# Patient Record
Sex: Male | Born: 1970 | Race: Black or African American | Hispanic: No | Marital: Single | Smoking: Current every day smoker
Health system: Southern US, Community
[De-identification: ages and names within clinical notes are randomized; demographics above are authoritative.]

## PROBLEM LIST (undated history)

## (undated) DIAGNOSIS — I10 Essential (primary) hypertension: Secondary | ICD-10-CM

## (undated) HISTORY — DX: Essential (primary) hypertension: I10

---

## 2003-07-23 ENCOUNTER — Emergency Department (HOSPITAL_COMMUNITY): Admission: EM | Admit: 2003-07-23 | Discharge: 2003-07-23 | Payer: Self-pay | Admitting: Emergency Medicine
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
## 2011-01-20 DIAGNOSIS — I10 Essential (primary) hypertension: Secondary | ICD-10-CM | POA: Insufficient documentation

## 2011-01-20 HISTORY — DX: Essential (primary) hypertension: I10

## 2012-03-05 ENCOUNTER — Other Ambulatory Visit: Payer: Self-pay | Admitting: Emergency Medicine

## 2012-04-08 ENCOUNTER — Other Ambulatory Visit: Payer: Self-pay | Admitting: Physician Assistant

## 2012-04-08 NOTE — Telephone Encounter (Signed)
Chart pulled at pa pool DOS: 01/24/12

## 2012-04-08 NOTE — Telephone Encounter (Signed)
Please pull paper chart.  

## 2012-07-03 ENCOUNTER — Other Ambulatory Visit: Payer: Self-pay | Admitting: Physician Assistant

## 2013-04-08 ENCOUNTER — Other Ambulatory Visit: Payer: Self-pay | Admitting: Physician Assistant

## 2017-09-03 ENCOUNTER — Ambulatory Visit: Payer: Self-pay | Admitting: Internal Medicine

## 2017-09-03 ENCOUNTER — Encounter: Payer: Self-pay | Admitting: Internal Medicine

## 2017-09-03 VITALS — BP 190/110 | HR 84 | Resp 14 | Ht 75.0 in | Wt 259.0 lb

## 2017-09-03 DIAGNOSIS — Z6831 Body mass index (BMI) 31.0-31.9, adult: Secondary | ICD-10-CM

## 2017-09-03 DIAGNOSIS — E669 Obesity, unspecified: Secondary | ICD-10-CM

## 2017-09-03 DIAGNOSIS — M25579 Pain in unspecified ankle and joints of unspecified foot: Secondary | ICD-10-CM

## 2017-09-03 DIAGNOSIS — Z716 Tobacco abuse counseling: Secondary | ICD-10-CM

## 2017-09-03 DIAGNOSIS — I10 Essential (primary) hypertension: Secondary | ICD-10-CM

## 2017-09-03 MED ORDER — LISINOPRIL-HYDROCHLOROTHIAZIDE 20-12.5 MG PO TABS: 1.0000 | ORAL_TABLET | Freq: Every day | ORAL | 3 refills | Status: DC

## 2017-09-03 NOTE — Progress Notes (Signed)
Subjective:    Patient ID: Philip Parrish, male    Jabier GaussDOB: 1970-04-20, 47 y.o.   MRN: [id number]  HPI Here to establish  1.  Essential Hypertension:  Diagnosed in 2013.  Has been controlled down to 140/80 with Lisinopril/HCTZ 20-12.5 mg.  Has been off medication for about a month--was halving medication prior to that to make them last.    Diet:  Processed food 1st meal:  8 a.m.:  Cheese eggs and toast/fried bologna.  Second meal at 8 p.m.:  Steak or spaghetti, pork chop. Veggies.    2.  Tobacco Use:  Black and Milds:  3-4 daily.  Smoked for 22 years.  No cigarettes Review of Systems  3.  Maybe gout of right toe and ankle abour 2-3 months ago.  Had been eating rich foods:  Steak and shrimp  Current Meds  Medication Sig  . lisinopril-hydrochlorothiazide (PRINZIDE,ZESTORETIC) 20-12.5 MG tablet Take 1 tablet by mouth daily.  . Multiple Vitamin (MULTIVITAMIN) tablet Take 1 tablet by mouth daily.  . [DISCONTINUED] lisinopril-hydrochlorothiazide (PRINZIDE,ZESTORETIC) 20-12.5 MG per tablet TAKE 1 TABLET BY MOUTH DAILY    No Known Allergies    Past Medical History:  Diagnosis Date  . Hypertension 2013   History reviewed. No pertinent surgical history.   Family History  Problem Relation Age of Onset  . Hypertension Mother   . Cancer Mother        Pancreatic cancer in 2015  . Diabetes Father        Died of complications  . Kidney disease Father   . Hyperlipidemia Father   . Hypertension Father   . Asthma Daughter   . Hearing loss Daughter     Social History   Socioeconomic History  . Marital status: Single    Spouse name: Not on file  . Number of children: Not on file  . Years of education: Not on file  . Highest education level: Not on file  Occupational History  . Occupation: previously Location managermachine operator  Social Needs  . Financial resource strain: Not on file  . Food insecurity:    Worry: Never true    Inability: Never true  . Transportation needs:    Medical:  No    Non-medical: No  Tobacco Use  . Smoking status: Current Every Day Smoker    Packs/day: 3.00    Years: 2.00    Pack years: 6.00    Types: Cigars  . Smokeless tobacco: Never Used  Substance and Sexual Activity  . Alcohol use: Yes    Alcohol/week: 2.0 standard drinks    Types: 2 Shots of liquor per week  . Drug use: Never  . Sexual activity: Not on file  Lifestyle  . Physical activity:    Days per week: Not on file    Minutes per session: Not on file  . Stress: Not at all  Relationships  . Social connections:    Talks on phone: Not on file    Gets together: Not on file    Attends religious service: Not on file    Active member of club or organization: Not on file    Attends meetings of clubs or organizations: Not on file    Relationship status: Not on file  . Intimate partner violence:    Fear of current or ex partner: Not on file    Emotionally abused: No    Physically abused: No    Forced sexual activity: Not on file  Other Topics Concern  .  Not on file  Social History Narrative   Lives alone in apartment.   47 yo daughter is a Consulting civil engineerstudent at [employer]     Objective:   Physical Exam NAD HEENT:  PERRL, EOMI, discs sharp,  TMs pearly gray, throat without injection.   Neck:  Supple, No adenopathy, no thyromegaly Chest:  CTA CV:  RRR with normal S1 and S2, No S3, S4 or murmur.  No carotid bruit.  Carotid, radial and DP pulses normal and equal. Abd:  S, NT, No HSM or mass, + BS LE:  No edema. MS:  No current inflammation of joint, particularly MTP joints.       Assessment & Plan:  1.  Hypertension:  Lisinopril/HCTZ 20/12.5 mg daily.  Followup in 2 weeks for bp and pulse check with fasting labs:  FLP, CBC, CMP, uric acid.  2.  Possible Gout:  To lay off a lot of shellfish all at once.  Uric acid as above.    3.  Tobacco abuse:  Quitline card given.  Discussed discontinuing options.  4.  Obesity:  To work on diet and gradually increasing daily  physical activity.  Follow up with me in 3 months.

## 2017-09-03 NOTE — Progress Notes (Signed)
LCSW completed new patient screening with patient in order to assess for mental health concerns and/or problems with social determinants of health (food, housing, transportation, interpersonal violence).  Pt reported that he had no problems with any of the SDOH. He denied any current mental health symptoms, sharing that his stress level is very low and he tries to stay positive.  No follow up needed.

## 2017-09-03 NOTE — Patient Instructions (Signed)

## 2017-10-15 ENCOUNTER — Other Ambulatory Visit: Payer: Self-pay

## 2017-10-22 ENCOUNTER — Other Ambulatory Visit: Payer: Self-pay

## 2017-10-29 ENCOUNTER — Other Ambulatory Visit: Payer: Self-pay

## 2017-10-29 DIAGNOSIS — Z13220 Encounter for screening for lipoid disorders: Secondary | ICD-10-CM

## 2017-10-29 DIAGNOSIS — M109 Gout, unspecified: Secondary | ICD-10-CM

## 2017-10-29 DIAGNOSIS — Z79899 Other long term (current) drug therapy: Secondary | ICD-10-CM

## 2017-10-30 LAB — COMPREHENSIVE METABOLIC PANEL
A/G RATIO: 1.8 (ref 1.2–2.2)
ALBUMIN: 4.6 g/dL (ref 3.5–5.5)
ALK PHOS: 39 IU/L (ref 39–117)
ALT: 27 IU/L (ref 0–44)
AST: 24 IU/L (ref 0–40)
BILIRUBIN TOTAL: 0.4 mg/dL (ref 0.0–1.2)
BUN / CREAT RATIO: 10 (ref 9–20)
BUN: 11 mg/dL (ref 6–24)
CHLORIDE: 97 mmol/L (ref 96–106)
CO2: 26 mmol/L (ref 20–29)
Calcium: 10.1 mg/dL (ref 8.7–10.2)
Creatinine, Ser: 1.09 mg/dL (ref 0.76–1.27)
GFR calc non Af Amer: 80 mL/min/{1.73_m2} (ref >59)
GFR, EST AFRICAN AMERICAN: 93 mL/min/{1.73_m2} (ref >59)
GLOBULIN, TOTAL: 2.5 g/dL (ref 1.5–4.5)
Glucose: 96 mg/dL (ref 65–99)
POTASSIUM: 4.7 mmol/L (ref 3.5–5.2)
SODIUM: 138 mmol/L (ref 134–144)
Total Protein: 7.1 g/dL (ref 6.0–8.5)

## 2017-10-30 LAB — CBC WITH DIFFERENTIAL/PLATELET
BASOS: 1 %
Basophils Absolute: 0 10*3/uL (ref 0.0–0.2)
EOS (ABSOLUTE): 0.2 10*3/uL (ref 0.0–0.4)
Eos: 3 %
HEMATOCRIT: 39.9 % (ref 37.5–51.0)
Hemoglobin: 14 g/dL (ref 13.0–17.7)
IMMATURE GRANS (ABS): 0 10*3/uL (ref 0.0–0.1)
Immature Granulocytes: 0 %
LYMPHS ABS: 2.4 10*3/uL (ref 0.7–3.1)
Lymphs: 45 %
MCH: 29.9 pg (ref 26.6–33.0)
MCHC: 35.1 g/dL (ref 31.5–35.7)
MCV: 85 fL (ref 79–97)
Monocytes Absolute: 0.6 10*3/uL (ref 0.1–0.9)
Monocytes: 11 %
NEUTROS ABS: 2.1 10*3/uL (ref 1.4–7.0)
Neutrophils: 40 %
Platelets: 208 10*3/uL (ref 150–450)
RBC: 4.68 x10E6/uL (ref 4.14–5.80)
RDW: 13.4 % (ref 12.3–15.4)
WBC: 5.3 10*3/uL (ref 3.4–10.8)

## 2017-10-30 LAB — LIPID PANEL W/O CHOL/HDL RATIO
Cholesterol, Total: 244 mg/dL — ABNORMAL HIGH (ref 100–199)
HDL: 65 mg/dL (ref >39)
LDL Calculated: 148 mg/dL — ABNORMAL HIGH (ref 0–99)
Triglycerides: 153 mg/dL — ABNORMAL HIGH (ref 0–149)
VLDL Cholesterol Cal: 31 mg/dL (ref 5–40)

## 2017-10-30 LAB — URIC ACID: Uric Acid: 7.5 mg/dL (ref 3.7–8.6)

## 2017-12-06 ENCOUNTER — Encounter: Payer: Self-pay | Admitting: Internal Medicine

## 2017-12-17 ENCOUNTER — Ambulatory Visit: Payer: Self-pay | Admitting: Internal Medicine

## 2017-12-22 ENCOUNTER — Ambulatory Visit: Payer: Self-pay | Admitting: Internal Medicine

## 2018-01-28 ENCOUNTER — Ambulatory Visit: Payer: Self-pay | Admitting: Internal Medicine

## 2018-02-17 ENCOUNTER — Other Ambulatory Visit: Payer: Self-pay

## 2018-02-18 ENCOUNTER — Other Ambulatory Visit: Payer: Self-pay

## 2018-02-18 DIAGNOSIS — Z13220 Encounter for screening for lipoid disorders: Secondary | ICD-10-CM

## 2018-02-19 LAB — LIPID PANEL W/O CHOL/HDL RATIO
Cholesterol, Total: 274 mg/dL — ABNORMAL HIGH (ref 100–199)
HDL: 60 mg/dL (ref >39)
LDL Calculated: 185 mg/dL — ABNORMAL HIGH (ref 0–99)
Triglycerides: 147 mg/dL (ref 0–149)
VLDL Cholesterol Cal: 29 mg/dL (ref 5–40)

## 2018-03-11 ENCOUNTER — Ambulatory Visit: Payer: Self-pay | Admitting: Internal Medicine

## 2018-04-15 ENCOUNTER — Ambulatory Visit: Payer: Self-pay | Admitting: Internal Medicine

## 2018-06-03 ENCOUNTER — Encounter: Payer: Self-pay | Admitting: Internal Medicine

## 2018-06-03 ENCOUNTER — Ambulatory Visit: Payer: Self-pay | Admitting: Internal Medicine

## 2018-06-03 ENCOUNTER — Other Ambulatory Visit: Payer: Self-pay

## 2018-06-03 VITALS — BP 158/94 | HR 70 | Resp 12 | Ht 75.0 in | Wt 263.0 lb

## 2018-06-03 DIAGNOSIS — I10 Essential (primary) hypertension: Secondary | ICD-10-CM

## 2018-06-03 DIAGNOSIS — E7800 Pure hypercholesterolemia, unspecified: Secondary | ICD-10-CM

## 2018-06-03 DIAGNOSIS — E669 Obesity, unspecified: Secondary | ICD-10-CM

## 2018-06-03 DIAGNOSIS — Z716 Tobacco abuse counseling: Secondary | ICD-10-CM

## 2018-06-03 DIAGNOSIS — Z6831 Body mass index (BMI) 31.0-31.9, adult: Secondary | ICD-10-CM

## 2018-06-03 MED ORDER — LISINOPRIL 20 MG PO TABS: 20.0000 mg | ORAL_TABLET | Freq: Every day | ORAL | 3 refills | Status: DC

## 2018-06-03 NOTE — Patient Instructions (Signed)
Drink a glass of water before every meal Drink 6-8 glasses of water daily Eat three meals daily Eat a protein and healthy fat with every meal (eggs,fish, chicken, [place] and limit red meats) Eat 5 servings of vegetables daily, mix the colors Eat 2 servings of fruit daily with skin, if skin is edible Use smaller plates Put food/utensils down as you chew and swallow each bite Eat at a table with friends/family at least once daily, no TV Do not eat in front of the TV  Recent studies show that people who consume all of their calories in a 12 hour period lose weight more efficiently.  For example, if you eat your first meal at 7:00 a.m., your last meal of the day should be completed by 7:00 p.m.  Get out and walk Call the quit line and get started Do things every day you enjoy that don't involve watching video and eating

## 2018-06-03 NOTE — Progress Notes (Signed)
    Subjective:    Patient ID: Philip Parrish, male   DOB: 06-24-1970, 48 y.o.   MRN: [id number]   HPI  Multiple cancelled appts and delayed follow up  1.  Hypertension:  Has missed the med intermittently, but not recently.   Is not necessarily good about taking his medication every day.    2.  Hypercholesterolemia:  Very poor diet.  3.  GERD:  Occasional cola or tea.  + smoking.  Black and Milds 3 daily.  Some tomatoes.  Lot of fried foods.   Drinking more alcohol with covid19 as well.  Lot of heartburn and belching.   Uses Tums a lot--generally daily.    Current Meds  Medication Sig  . ibuprofen (ADVIL) 200 MG tablet Take 200 mg by mouth every 6 (six) hours as needed.  Marland Kitchen lisinopril-hydrochlorothiazide (PRINZIDE,ZESTORETIC) 20-12.5 MG tablet Take 1 tablet by mouth daily.  . Multiple Vitamin (MULTIVITAMIN) tablet Take 1 tablet by mouth daily.   No Known Allergies   Review of Systems    Objective:   BP (!) 158/94 (BP Location: Left Arm, Patient Position: Sitting, Cuff Size: Large)   Pulse 70   Resp 12   Ht 6\' 3"  (1.905 m)   Wt 263 lb (119.3 kg)   BMI 32.87 kg/m   Physical Exam  HEENT:  PERRL, EOMI throat without injection. Neck:  Supple, No adenopathy Chest:  CTA CV:  RRR without murmur or rub. Radial and DP pulses normal and equal. LE: no edema.   Assessment & Plan   1.  Hypertension:  Improved but not at goal.  Add another 20 mg of Lisinopril to Lisinopril/HCTZ 20/25 mg. Encouraged him to keep his medication where he eats his first meal or a daily regular activity.  2.  Tobacco abuse:  Discussed quit line and to consider nicotine lozenges or gum for cravings.  3.  Obesity:  Has gained more weight during pandemic.  Encouraged him to get outside and walk regularly as long as distancing from others. Went over Improving eating habits as well.  4.  Hypercholesterolemia:  As in #3

## 2018-06-13 DIAGNOSIS — E669 Obesity, unspecified: Secondary | ICD-10-CM | POA: Insufficient documentation

## 2018-06-13 DIAGNOSIS — E7800 Pure hypercholesterolemia, unspecified: Secondary | ICD-10-CM | POA: Insufficient documentation

## 2018-07-04 ENCOUNTER — Other Ambulatory Visit: Payer: Self-pay

## 2018-07-04 VITALS — BP 132/82 | HR 76 | Wt 263.0 lb

## 2018-07-04 DIAGNOSIS — I10 Essential (primary) hypertension: Secondary | ICD-10-CM

## 2018-07-04 NOTE — Progress Notes (Signed)
Patient BP in normal range. Informed to continue current dose of medication. Patient verbalized understanding. 

## 2018-07-05 LAB — BASIC METABOLIC PANEL
BUN/Creatinine Ratio: 10 (ref 9–20)
BUN: 13 mg/dL (ref 6–24)
CO2: 23 mmol/L (ref 20–29)
Calcium: 9.5 mg/dL (ref 8.7–10.2)
Chloride: 103 mmol/L (ref 96–106)
Creatinine, Ser: 1.24 mg/dL (ref 0.76–1.27)
GFR calc Af Amer: 80 mL/min/{1.73_m2} (ref >59)
GFR calc non Af Amer: 69 mL/min/{1.73_m2} (ref >59)
Glucose: 102 mg/dL — ABNORMAL HIGH (ref 65–99)
Potassium: 4.4 mmol/L (ref 3.5–5.2)
Sodium: 140 mmol/L (ref 134–144)

## 2018-08-30 ENCOUNTER — Other Ambulatory Visit: Payer: Self-pay

## 2018-09-02 ENCOUNTER — Ambulatory Visit: Payer: Self-pay | Admitting: Internal Medicine

## 2018-09-27 ENCOUNTER — Other Ambulatory Visit (INDEPENDENT_AMBULATORY_CARE_PROVIDER_SITE_OTHER): Payer: Self-pay

## 2018-09-27 ENCOUNTER — Other Ambulatory Visit: Payer: Self-pay

## 2018-09-27 DIAGNOSIS — I10 Essential (primary) hypertension: Secondary | ICD-10-CM

## 2018-09-27 DIAGNOSIS — E7800 Pure hypercholesterolemia, unspecified: Secondary | ICD-10-CM

## 2018-09-28 LAB — COMPREHENSIVE METABOLIC PANEL
ALT: 28 IU/L (ref 0–44)
AST: 22 IU/L (ref 0–40)
Albumin/Globulin Ratio: 2.4 — ABNORMAL HIGH (ref 1.2–2.2)
Albumin: 4.8 g/dL (ref 4.0–5.0)
Alkaline Phosphatase: 49 IU/L (ref 39–117)
BUN/Creatinine Ratio: 8 — ABNORMAL LOW (ref 9–20)
BUN: 12 mg/dL (ref 6–24)
Bilirubin Total: 0.3 mg/dL (ref 0.0–1.2)
CO2: 25 mmol/L (ref 20–29)
Calcium: 8.9 mg/dL (ref 8.7–10.2)
Chloride: 101 mmol/L (ref 96–106)
Creatinine, Ser: 1.44 mg/dL — ABNORMAL HIGH (ref 0.76–1.27)
GFR calc Af Amer: 66 mL/min/{1.73_m2} (ref >59)
GFR calc non Af Amer: 57 mL/min/{1.73_m2} — ABNORMAL LOW (ref >59)
Globulin, Total: 2 g/dL (ref 1.5–4.5)
Glucose: 90 mg/dL (ref 65–99)
Potassium: 4.6 mmol/L (ref 3.5–5.2)
Sodium: 141 mmol/L (ref 134–144)
Total Protein: 6.8 g/dL (ref 6.0–8.5)

## 2018-09-28 LAB — LIPID PANEL W/O CHOL/HDL RATIO
Cholesterol, Total: 225 mg/dL — ABNORMAL HIGH (ref 100–199)
HDL: 52 mg/dL (ref >39)
LDL Chol Calc (NIH): 140 mg/dL — ABNORMAL HIGH (ref 0–99)
Triglycerides: 182 mg/dL — ABNORMAL HIGH (ref 0–149)
VLDL Cholesterol Cal: 33 mg/dL (ref 5–40)

## 2018-10-03 ENCOUNTER — Ambulatory Visit: Payer: Self-pay | Admitting: Internal Medicine

## 2018-11-04 ENCOUNTER — Ambulatory Visit: Payer: Self-pay | Admitting: Internal Medicine

## 2018-11-19 ENCOUNTER — Encounter: Payer: Self-pay | Admitting: Internal Medicine

## 2018-12-11 ENCOUNTER — Other Ambulatory Visit: Payer: Self-pay | Admitting: Internal Medicine

## 2018-12-19 ENCOUNTER — Ambulatory Visit: Payer: Self-pay | Admitting: Internal Medicine

## 2019-03-17 ENCOUNTER — Other Ambulatory Visit: Payer: Self-pay | Admitting: Internal Medicine

## 2019-06-16 ENCOUNTER — Other Ambulatory Visit: Payer: Self-pay | Admitting: Internal Medicine

## 2019-08-06 ENCOUNTER — Other Ambulatory Visit: Payer: Self-pay | Admitting: Internal Medicine

## 2019-09-01 ENCOUNTER — Other Ambulatory Visit: Payer: Self-pay

## 2019-09-01 MED ORDER — LISINOPRIL 20 MG PO TABS: 20.0000 mg | ORAL_TABLET | Freq: Every day | ORAL | 0 refills | Status: DC

## 2019-09-01 MED ORDER — LISINOPRIL-HYDROCHLOROTHIAZIDE 20-12.5 MG PO TABS: ORAL_TABLET | ORAL | 0 refills | Status: DC

## 2019-09-11 ENCOUNTER — Ambulatory Visit: Payer: Self-pay | Admitting: Internal Medicine

## 2019-09-20 ENCOUNTER — Ambulatory Visit: Payer: Self-pay | Admitting: Internal Medicine

## 2019-09-26 ENCOUNTER — Encounter: Payer: Self-pay | Admitting: Internal Medicine

## 2019-09-26 ENCOUNTER — Ambulatory Visit: Payer: Self-pay | Admitting: Internal Medicine

## 2019-09-26 VITALS — BP 152/88 | HR 76 | Resp 12 | Ht 75.0 in | Wt 270.0 lb

## 2019-09-26 DIAGNOSIS — M545 Low back pain, unspecified: Secondary | ICD-10-CM

## 2019-09-26 DIAGNOSIS — E669 Obesity, unspecified: Secondary | ICD-10-CM

## 2019-09-26 DIAGNOSIS — E66811 Obesity, class 1: Secondary | ICD-10-CM

## 2019-09-26 DIAGNOSIS — E7800 Pure hypercholesterolemia, unspecified: Secondary | ICD-10-CM

## 2019-09-26 DIAGNOSIS — Z79899 Other long term (current) drug therapy: Secondary | ICD-10-CM

## 2019-09-26 DIAGNOSIS — I10 Essential (primary) hypertension: Secondary | ICD-10-CM

## 2019-09-26 DIAGNOSIS — Z23 Encounter for immunization: Secondary | ICD-10-CM

## 2019-09-26 DIAGNOSIS — Z716 Tobacco abuse counseling: Secondary | ICD-10-CM

## 2019-09-26 DIAGNOSIS — Z6831 Body mass index (BMI) 31.0-31.9, adult: Secondary | ICD-10-CM

## 2019-09-26 DIAGNOSIS — M1A9XX0 Chronic gout, unspecified, without tophus (tophi): Secondary | ICD-10-CM

## 2019-09-26 MED ORDER — ALLOPURINOL 100 MG PO TABS: 100.0000 mg | ORAL_TABLET | Freq: Every day | ORAL | 11 refills | Status: DC

## 2019-09-26 MED ORDER — AMLODIPINE BESYLATE 5 MG PO TABS: 5.0000 mg | ORAL_TABLET | Freq: Every day | ORAL | 11 refills | Status: DC

## 2019-09-26 MED ORDER — CYCLOBENZAPRINE HCL 10 MG PO TABS: ORAL_TABLET | ORAL | 1 refills | Status: DC

## 2019-09-26 NOTE — Progress Notes (Signed)
    Subjective:    Patient ID: Philip Parrish, male   DOB: 04-08-1970, 49 y.o.   MRN: [id number]   HPI   1.  Hypertension:  Taking Lisinopril 20 mg an Lisinopril/HCTZ 20/12.5 mg daily without problems.  2.  Left ankle swelled up and was hopping around--started about 1 month ago.  Was tender to touch, but does not describe erythema. States had similar problem with his right elbow in past.  Was taking ibuprofen every 6 hours, 800 mg.   Ankle is better now, but he now has left low back muscle tightness and pressure and now going into his left lateral thigh.   We tested his uric acid in 2019 and was in normal range.     Current Meds  Medication Sig  . ibuprofen (ADVIL) 200 MG tablet Take 200 mg by mouth every 6 (six) hours as needed.  Marland Kitchen lisinopril (ZESTRIL) 20 MG tablet Take 1 tablet (20 mg total) by mouth daily.  Marland Kitchen lisinopril-hydrochlorothiazide (ZESTORETIC) 20-12.5 MG tablet TAKE 1 TABLET BY MOUTH ONCE DAILY . APPOINTMENT REQUIRED FOR FUTURE REFILLS  . Multiple Vitamin (MULTIVITAMIN) tablet Take 1 tablet by mouth daily.   No Known Allergies  Social History   Social History Narrative   Lives alone in apartment.   67 yo daughter is a Consulting civil engineer at [employer]   Social History   Tobacco Use  . Smoking status: Current Every Day Smoker    Packs/day: 3.00    Years: 2.00    Pack years: 6.00    Types: Cigars  . Smokeless tobacco: Never Used  Vaping Use  . Vaping Use: Never used  Substance Use Topics  . Alcohol use: Yes    Alcohol/week: 2.0 standard drinks    Types: 2 Shots of liquor per week  . Drug use: Never    Review of Systems    Objective:   BP (!) 144/92 (BP Location: Left Arm, Patient Position: Sitting, Cuff Size: Large)   Pulse 76   Resp 12   Ht 6\' 3"  (1.905 m)   Wt 270 lb (122.5 kg)   BMI 33.75 kg/m   Physical Exam  NAD HEENT:  PERRL, EOMI Neck:  Supple, No adenopathy Chest:  CTA CV:  RRR without murmur or rub.  Radial and DP pulses normal and  equal Abd:  S, NT, No HSM or mass, + BS MS:  NT over spinous processes of thoracic/L/S spine.  Tender over left lumbar paraspinous musculature. Ankle and elbow without tophi, erythema or swelling currently Neuro:  A & O x 3, CN II-XII grossly intact, DTRs 2+/4, Motor 5/5 throughout.    Assessment & Plan  1.  Hypertension:  Add Amlodipine 5 mg daily to Lisinopril 20 mg and Lisinopril/HCTZ 20/12.5 mg.  Follow up in 2 weeks for repeat BP/pules CmP, CBC  2.  Likely Gouty attacks despite previously normal Uric Acid based on history.  No active inflammation currently.  Start Allopurinol 100 mg daily.  3.  Hypercholesterolemia:  FLP  4.  Tobacco Abuse:  Encouraged nicotine gum or lozenges to stop smoking cigars.  Does not seem motivated to quit.  5.  LEft low back pain:  Likely muscular.  Cyclobenzaprine for sleep and rest of muscle.  Stretches discussed.  6.  COvID vaccination with moderna initiated  7.  Obesity:  Encouraged changes to eating behaviors and regular physical activity once back improved

## 2019-09-27 LAB — LIPID PANEL W/O CHOL/HDL RATIO
Cholesterol, Total: 259 mg/dL — ABNORMAL HIGH (ref 100–199)
HDL: 50 mg/dL (ref >39)
LDL Chol Calc (NIH): 185 mg/dL — ABNORMAL HIGH (ref 0–99)
Triglycerides: 133 mg/dL (ref 0–149)
VLDL Cholesterol Cal: 24 mg/dL (ref 5–40)

## 2019-09-27 LAB — COMPREHENSIVE METABOLIC PANEL
ALT: 30 IU/L (ref 0–44)
AST: 28 IU/L (ref 0–40)
Albumin/Globulin Ratio: 1.8 (ref 1.2–2.2)
Albumin: 4.8 g/dL (ref 4.0–5.0)
Alkaline Phosphatase: 64 IU/L (ref 48–121)
BUN/Creatinine Ratio: 12 (ref 9–20)
BUN: 14 mg/dL (ref 6–24)
Bilirubin Total: 0.3 mg/dL (ref 0.0–1.2)
CO2: 26 mmol/L (ref 20–29)
Calcium: 9.9 mg/dL (ref 8.7–10.2)
Chloride: 98 mmol/L (ref 96–106)
Creatinine, Ser: 1.21 mg/dL (ref 0.76–1.27)
GFR calc Af Amer: 81 mL/min/{1.73_m2} (ref >59)
GFR calc non Af Amer: 70 mL/min/{1.73_m2} (ref >59)
Globulin, Total: 2.7 g/dL (ref 1.5–4.5)
Glucose: 89 mg/dL (ref 65–99)
Potassium: 4.4 mmol/L (ref 3.5–5.2)
Sodium: 138 mmol/L (ref 134–144)
Total Protein: 7.5 g/dL (ref 6.0–8.5)

## 2019-09-27 LAB — CBC WITH DIFFERENTIAL/PLATELET
Basophils Absolute: 0 10*3/uL (ref 0.0–0.2)
Basos: 1 %
EOS (ABSOLUTE): 0.1 10*3/uL (ref 0.0–0.4)
Eos: 2 %
Hematocrit: 43.2 % (ref 37.5–51.0)
Hemoglobin: 14.7 g/dL (ref 13.0–17.7)
Immature Grans (Abs): 0 10*3/uL (ref 0.0–0.1)
Immature Granulocytes: 0 %
Lymphocytes Absolute: 2.3 10*3/uL (ref 0.7–3.1)
Lymphs: 35 %
MCH: 29.1 pg (ref 26.6–33.0)
MCHC: 34 g/dL (ref 31.5–35.7)
MCV: 86 fL (ref 79–97)
Monocytes Absolute: 0.7 10*3/uL (ref 0.1–0.9)
Monocytes: 10 %
Neutrophils Absolute: 3.4 10*3/uL (ref 1.4–7.0)
Neutrophils: 52 %
Platelets: 241 10*3/uL (ref 150–450)
RBC: 5.05 x10E6/uL (ref 4.14–5.80)
RDW: 12.9 % (ref 11.6–15.4)
WBC: 6.5 10*3/uL (ref 3.4–10.8)

## 2019-09-28 ENCOUNTER — Other Ambulatory Visit: Payer: Self-pay | Admitting: Internal Medicine

## 2019-10-23 ENCOUNTER — Ambulatory Visit (INDEPENDENT_AMBULATORY_CARE_PROVIDER_SITE_OTHER): Payer: Self-pay

## 2019-10-23 VITALS — BP 152/90 | HR 80

## 2019-10-23 DIAGNOSIS — I10 Essential (primary) hypertension: Secondary | ICD-10-CM

## 2019-10-24 NOTE — Progress Notes (Signed)
Patient in for BP check not taking medication as prescribed by Dr. Delrae Alfred. He was off lisinopril 20 mg since last office visit and missed two days of other BP medications. BP 152/90 P 88. Per Dr. Delrae Alfred patient needs to take all medication as prescribed recheck BP and pulse in 1 month. Patient advised and appointment [facility] CMA

## 2019-11-30 ENCOUNTER — Other Ambulatory Visit: Payer: Self-pay | Admitting: Internal Medicine

## 2019-11-30 DIAGNOSIS — M545 Low back pain, unspecified: Secondary | ICD-10-CM | POA: Insufficient documentation

## 2019-12-01 ENCOUNTER — Ambulatory Visit: Payer: Self-pay

## 2019-12-01 VITALS — BP 132/80 | HR 84

## 2019-12-01 DIAGNOSIS — I10 Essential (primary) hypertension: Secondary | ICD-10-CM

## 2019-12-01 MED ORDER — LISINOPRIL 20 MG PO TABS: ORAL_TABLET | ORAL | 11 refills | Status: DC

## 2020-01-09 ENCOUNTER — Other Ambulatory Visit: Payer: Self-pay

## 2020-01-22 ENCOUNTER — Other Ambulatory Visit: Payer: Self-pay | Admitting: Internal Medicine

## 2020-01-26 ENCOUNTER — Ambulatory Visit: Payer: Self-pay | Admitting: Internal Medicine

## 2020-03-17 NOTE — Progress Notes (Signed)
BP fine  No change in meds

## 2020-05-20 ENCOUNTER — Other Ambulatory Visit: Payer: Self-pay | Admitting: Internal Medicine

## 2020-05-20 ENCOUNTER — Other Ambulatory Visit: Payer: Self-pay

## 2020-05-20 DIAGNOSIS — E7800 Pure hypercholesterolemia, unspecified: Secondary | ICD-10-CM

## 2020-05-21 LAB — LIPID PANEL W/O CHOL/HDL RATIO
Cholesterol, Total: 232 mg/dL — ABNORMAL HIGH (ref 100–199)
HDL: 47 mg/dL (ref >39)
LDL Chol Calc (NIH): 138 mg/dL — ABNORMAL HIGH (ref 0–99)
Triglycerides: 262 mg/dL — ABNORMAL HIGH (ref 0–149)
VLDL Cholesterol Cal: 47 mg/dL — ABNORMAL HIGH (ref 5–40)

## 2020-05-22 ENCOUNTER — Ambulatory Visit: Payer: Self-pay | Admitting: Internal Medicine

## 2020-05-24 ENCOUNTER — Ambulatory Visit: Payer: Self-pay | Admitting: Internal Medicine

## 2020-08-28 ENCOUNTER — Ambulatory Visit: Payer: Self-pay | Admitting: Internal Medicine

## 2020-08-29 ENCOUNTER — Ambulatory Visit: Payer: Self-pay | Admitting: Internal Medicine

## 2020-11-23 ENCOUNTER — Other Ambulatory Visit: Payer: Self-pay | Admitting: Internal Medicine

## 2020-11-30 ENCOUNTER — Other Ambulatory Visit: Payer: Self-pay | Admitting: Internal Medicine

## 2020-12-20 ENCOUNTER — Other Ambulatory Visit: Payer: Self-pay

## 2020-12-20 DIAGNOSIS — I10 Essential (primary) hypertension: Secondary | ICD-10-CM

## 2020-12-28 ENCOUNTER — Other Ambulatory Visit: Payer: Self-pay | Admitting: Internal Medicine

## 2020-12-28 DIAGNOSIS — I10 Essential (primary) hypertension: Secondary | ICD-10-CM

## 2020-12-31 ENCOUNTER — Other Ambulatory Visit: Payer: Self-pay

## 2021-01-07 ENCOUNTER — Ambulatory Visit: Payer: BC Managed Care – PPO | Admitting: Internal Medicine

## 2021-01-08 ENCOUNTER — Other Ambulatory Visit: Payer: Self-pay

## 2021-01-27 ENCOUNTER — Other Ambulatory Visit: Payer: Self-pay | Admitting: Internal Medicine

## 2021-01-27 DIAGNOSIS — I10 Essential (primary) hypertension: Secondary | ICD-10-CM

## 2021-02-10 ENCOUNTER — Other Ambulatory Visit: Payer: Self-pay

## 2021-02-10 ENCOUNTER — Other Ambulatory Visit (INDEPENDENT_AMBULATORY_CARE_PROVIDER_SITE_OTHER): Payer: BC Managed Care – PPO

## 2021-02-10 DIAGNOSIS — E7800 Pure hypercholesterolemia, unspecified: Secondary | ICD-10-CM | POA: Diagnosis not present

## 2021-02-11 LAB — LIPID PANEL W/O CHOL/HDL RATIO
Cholesterol, Total: 225 mg/dL — ABNORMAL HIGH (ref 100–199)
HDL: 48 mg/dL (ref >39)
LDL Chol Calc (NIH): 141 mg/dL — ABNORMAL HIGH (ref 0–99)
Triglycerides: 199 mg/dL — ABNORMAL HIGH (ref 0–149)
VLDL Cholesterol Cal: 36 mg/dL (ref 5–40)

## 2021-02-18 ENCOUNTER — Encounter: Payer: Self-pay | Admitting: Internal Medicine

## 2021-02-18 ENCOUNTER — Ambulatory Visit (INDEPENDENT_AMBULATORY_CARE_PROVIDER_SITE_OTHER): Payer: BC Managed Care – PPO | Admitting: Internal Medicine

## 2021-02-18 ENCOUNTER — Other Ambulatory Visit: Payer: Self-pay

## 2021-02-18 VITALS — BP 184/100 | HR 84 | Resp 12 | Ht 75.0 in | Wt 264.0 lb

## 2021-02-18 DIAGNOSIS — M1A9XX0 Chronic gout, unspecified, without tophus (tophi): Secondary | ICD-10-CM

## 2021-02-18 DIAGNOSIS — E782 Mixed hyperlipidemia: Secondary | ICD-10-CM | POA: Diagnosis not present

## 2021-02-18 DIAGNOSIS — I10 Essential (primary) hypertension: Secondary | ICD-10-CM | POA: Diagnosis not present

## 2021-02-18 MED ORDER — LISINOPRIL 20 MG PO TABS: 20.0000 mg | ORAL_TABLET | Freq: Every day | ORAL | 3 refills | Status: DC

## 2021-02-18 MED ORDER — LISINOPRIL 20 MG PO TABS: ORAL_TABLET | ORAL | 0 refills | Status: DC

## 2021-02-18 MED ORDER — AMLODIPINE BESYLATE 5 MG PO TABS: 5.0000 mg | ORAL_TABLET | Freq: Every day | ORAL | 3 refills | Status: DC

## 2021-02-18 MED ORDER — LISINOPRIL-HYDROCHLOROTHIAZIDE 20-12.5 MG PO TABS: ORAL_TABLET | ORAL | 3 refills | Status: DC

## 2021-02-18 MED ORDER — ALLOPURINOL 100 MG PO TABS: 100.0000 mg | ORAL_TABLET | Freq: Every day | ORAL | 3 refills | Status: DC

## 2021-02-18 NOTE — Progress Notes (Signed)
° ° °  Subjective:    Patient ID: Philip Parrish, male   DOB: 1970-08-14, 51 y.o.   MRN: [id number]   HPI   Hypertension:  Off his meds all weekend.  Works second shift.  Takes his meds before he leaves for 3 p.m. start time, but he is up most mornings from 6 to 8 a.m. and does eat breakfast.  No recent kidney function testing.  Has not been seen since 09/2019.    2.  Hyperlipidemia:  recent FLP still with high total, LDL in 140 range and somewhat high Trigs. Lipid Panel     Component Value Date/Time   CHOL 225 (H) 02/10/2021 0944   TRIG 199 (H) 02/10/2021 0944   HDL 48 02/10/2021 0944   LDLCALC 141 (H) 02/10/2021 0944   LABVLDL 36 02/10/2021 0944   3.  Tobacco Abuse:  not ready to quit smoking.     Current Meds  Medication Sig   allopurinol (ZYLOPRIM) 100 MG tablet Take 1 tablet by mouth once daily   amLODipine (NORVASC) 5 MG tablet Take 1 tablet by mouth once daily   ibuprofen (ADVIL) 200 MG tablet Take 200 mg by mouth every 6 (six) hours as needed.   lisinopril (ZESTRIL) 20 MG tablet TAKE 1 TABLET BY MOUTH ONCE DAILY   lisinopril-hydrochlorothiazide (ZESTORETIC) 20-12.5 MG tablet Take 1 tablet by mouth once daily   Multiple Vitamin (MULTIVITAMIN) tablet Take 1 tablet by mouth daily.   No Known Allergies   Review of Systems    Objective:   BP (!) 184/100 (BP Location: Right Arm, Patient Position: Sitting, Cuff Size: Normal)    Pulse 84    Resp 12    Ht 6\' 3"  (1.905 m)    Wt 264 lb (119.7 kg)    BMI 33.00 kg/m   Physical Exam NAD HEENT:  PERRL, EOMI Neck:  Supple, No adenopathy Chest:  CTA CV:  RRR with normal S1 and S2, No S3, S4 or murmur.  No carotid bruit.  Carotid, radial and DP pulses normal and equal Abd:  S, NT, No HSM or mass, + BS LE:  No edema   Assessment & Plan    Hypertension:  Sent all meds to [facility].  Has Rxs for everything save for plain Lisinopril for about 2 weeks or so, so sent 30 days supply of [facility]ril [facility].    BP  check in 2 weeks with non-fasting labs.    2.  Hyperlipidemia:  Start low dose Atorvastatin daily--also to mail order.  Fasting labs in 2 months.  3.  Tobacco:  did not want to address today--will discuss when follows up in 2 months for cholesterol.  4.  Gout:  Allopurinol refilled

## 2021-02-18 NOTE — Patient Instructions (Signed)
Please consider Moderna bivalent booster and Shingles vaccines at next visit.

## 2021-02-19 MED ORDER — ATORVASTATIN CALCIUM 20 MG PO TABS: 20.0000 mg | ORAL_TABLET | Freq: Every day | ORAL | 3 refills | Status: DC

## 2021-02-26 ENCOUNTER — Other Ambulatory Visit: Payer: Self-pay | Admitting: Internal Medicine

## 2021-03-03 ENCOUNTER — Other Ambulatory Visit: Payer: BC Managed Care – PPO

## 2021-04-14 ENCOUNTER — Other Ambulatory Visit (INDEPENDENT_AMBULATORY_CARE_PROVIDER_SITE_OTHER): Payer: BC Managed Care – PPO

## 2021-04-14 ENCOUNTER — Other Ambulatory Visit: Payer: Self-pay

## 2021-04-14 VITALS — BP 160/92

## 2021-04-14 DIAGNOSIS — E782 Mixed hyperlipidemia: Secondary | ICD-10-CM

## 2021-04-14 DIAGNOSIS — Z79899 Other long term (current) drug therapy: Secondary | ICD-10-CM

## 2021-04-14 DIAGNOSIS — Z0130 Encounter for examination of blood pressure without abnormal findings: Secondary | ICD-10-CM | POA: Diagnosis not present

## 2021-04-14 DIAGNOSIS — Z125 Encounter for screening for malignant neoplasm of prostate: Secondary | ICD-10-CM

## 2021-04-14 NOTE — Progress Notes (Signed)
Patient has not been taking amlodipine because he felt that it was too many pills. ? ?Patient has agreed to start taking all bp medication, including amlodipine. Will recheck bp in 2 weeks.  ?

## 2021-04-15 LAB — PSA: Prostate Specific Ag, Serum: 0.5 ng/mL (ref 0.0–4.0)

## 2021-04-15 LAB — CBC WITH DIFFERENTIAL/PLATELET
Basophils Absolute: 0 10*3/uL (ref 0.0–0.2)
Basos: 1 %
EOS (ABSOLUTE): 0.2 10*3/uL (ref 0.0–0.4)
Eos: 3 %
Hematocrit: 40 % (ref 37.5–51.0)
Hemoglobin: 13.5 g/dL (ref 13.0–17.7)
Immature Grans (Abs): 0 10*3/uL (ref 0.0–0.1)
Immature Granulocytes: 0 %
Lymphocytes Absolute: 1.9 10*3/uL (ref 0.7–3.1)
Lymphs: 32 %
MCH: 28.8 pg (ref 26.6–33.0)
MCHC: 33.8 g/dL (ref 31.5–35.7)
MCV: 86 fL (ref 79–97)
Monocytes Absolute: 0.6 10*3/uL (ref 0.1–0.9)
Monocytes: 9 %
Neutrophils Absolute: 3.4 10*3/uL (ref 1.4–7.0)
Neutrophils: 55 %
Platelets: 174 10*3/uL (ref 150–450)
RBC: 4.68 x10E6/uL (ref 4.14–5.80)
RDW: 12.8 % (ref 11.6–15.4)
WBC: 6.1 10*3/uL (ref 3.4–10.8)

## 2021-04-15 LAB — COMPREHENSIVE METABOLIC PANEL
ALT: 53 IU/L — ABNORMAL HIGH (ref 0–44)
AST: 33 IU/L (ref 0–40)
Albumin/Globulin Ratio: 2.6 — ABNORMAL HIGH (ref 1.2–2.2)
Albumin: 5 g/dL (ref 4.0–5.0)
Alkaline Phosphatase: 56 IU/L (ref 44–121)
BUN/Creatinine Ratio: 13 (ref 9–20)
BUN: 16 mg/dL (ref 6–24)
Bilirubin Total: 0.5 mg/dL (ref 0.0–1.2)
CO2: 25 mmol/L (ref 20–29)
Calcium: 9.8 mg/dL (ref 8.7–10.2)
Chloride: 103 mmol/L (ref 96–106)
Creatinine, Ser: 1.27 mg/dL (ref 0.76–1.27)
Globulin, Total: 1.9 g/dL (ref 1.5–4.5)
Glucose: 96 mg/dL (ref 70–99)
Potassium: 4.1 mmol/L (ref 3.5–5.2)
Sodium: 143 mmol/L (ref 134–144)
Total Protein: 6.9 g/dL (ref 6.0–8.5)
eGFR: 69 mL/min/{1.73_m2} (ref >59)

## 2021-04-15 LAB — HEPATIC FUNCTION PANEL: Bilirubin, Direct: 0.15 mg/dL (ref 0.00–0.40)

## 2021-04-15 LAB — LIPID PANEL W/O CHOL/HDL RATIO
Cholesterol, Total: 159 mg/dL (ref 100–199)
HDL: 54 mg/dL (ref >39)
LDL Chol Calc (NIH): 82 mg/dL (ref 0–99)
Triglycerides: 131 mg/dL (ref 0–149)
VLDL Cholesterol Cal: 23 mg/dL (ref 5–40)

## 2021-04-17 ENCOUNTER — Encounter: Payer: Self-pay | Admitting: Internal Medicine

## 2021-04-17 ENCOUNTER — Ambulatory Visit (INDEPENDENT_AMBULATORY_CARE_PROVIDER_SITE_OTHER): Payer: BC Managed Care – PPO | Admitting: Internal Medicine

## 2021-04-17 VITALS — BP 164/98 | HR 80 | Resp 16 | Ht 75.0 in | Wt 257.0 lb

## 2021-04-17 DIAGNOSIS — Z23 Encounter for immunization: Secondary | ICD-10-CM | POA: Diagnosis not present

## 2021-04-17 DIAGNOSIS — Z716 Tobacco abuse counseling: Secondary | ICD-10-CM

## 2021-04-17 DIAGNOSIS — I10 Essential (primary) hypertension: Secondary | ICD-10-CM | POA: Diagnosis not present

## 2021-04-17 DIAGNOSIS — R7401 Elevation of levels of liver transaminase levels: Secondary | ICD-10-CM

## 2021-04-17 DIAGNOSIS — E782 Mixed hyperlipidemia: Secondary | ICD-10-CM | POA: Diagnosis not present

## 2021-04-17 DIAGNOSIS — Z1211 Encounter for screening for malignant neoplasm of colon: Secondary | ICD-10-CM

## 2021-04-17 DIAGNOSIS — Z720 Tobacco use: Secondary | ICD-10-CM

## 2021-04-17 MED ORDER — AMLODIPINE BESYLATE 10 MG PO TABS: ORAL_TABLET | ORAL | 3 refills | Status: DC

## 2021-04-17 MED ORDER — AMLODIPINE BESYLATE 10 MG PO TABS: ORAL_TABLET | ORAL | 11 refills | Status: DC

## 2021-04-17 NOTE — Patient Instructions (Signed)
Take two of your Amlodipine 5 mg tabs once daily at same time. ?

## 2021-04-17 NOTE — Progress Notes (Signed)
? ? ?  Subjective:  ?  ?Patient ID: Philip Parrish, male   DOB: 1970/04/22, 51 y.o.   MRN: [id number] ? ? ?HPI ? ? Hypertension:  Not controlled on 3 meds.   ? ?2.  Hyperlipidemia:  has cut back on chips and fried foods as well as sweets.  Tolerating Atorvastatin fine.  ALT was slightly elevated.  Very physically active.   ? ?3.  HM:  PSA, CBC, CMP other than ALT normal.   ? ?4.  Tobacco Abuse:  still not interested in quitting Black and Mild cigars.  Smokes 2-3 daily.  Smokes to relieve stress.  May consider nicotine gum.  Has started chewing gum, which he feels will help when ready to quit.   ? ? ?Current Meds  ?Medication Sig  ? allopurinol (ZYLOPRIM) 100 MG tablet Take 1 tablet by mouth once daily  ? amLODipine (NORVASC) 5 MG tablet Take 1 tablet by mouth once daily  ? atorvastatin (LIPITOR) 20 MG tablet Take 1 tablet (20 mg total) by mouth daily.  ? ibuprofen (ADVIL) 200 MG tablet Take 200 mg by mouth every 6 (six) hours as needed.  ? lisinopril (ZESTRIL) 20 MG tablet 1 tab by mouth daily in morning with Lisinopril/HCTZ tab  ? lisinopril (ZESTRIL) 20 MG tablet Take 1 tablet (20 mg total) by mouth daily.  ? lisinopril-hydrochlorothiazide (ZESTORETIC) 20-12.5 MG tablet Take 1 tablet by mouth once daily  ? Multiple Vitamin (MULTIVITAMIN) tablet Take 1 tablet by mouth daily.  ? ?No Known Allergies ? ? ?Review of Systems ? ? ? ?Objective:  ? ?BP (!) 164/98 (BP Location: Left Arm, Patient Position: Sitting, Cuff Size: Normal)   Pulse 80   Resp 16   Ht 6\' 3"  (1.905 m)   Wt 257 lb (116.6 kg)   BMI 32.12 kg/m?  ? ?Physical Exam ?NAD ?HEENT: PERRL, EOMI ?Neck:  Supple, No adenopathy ?Chest:  CTA ?CV: RRR without murmur or rub.  Radial and DP pulses normal and equal ?ABd: S NT, No HSM or mass, + BS ?LE:  No edema. ? ? ?Assessment & Plan  ? ? Hypertension:  not at goal.  Increase Amlodipine 10 mg daily.   Bp and pulse check in 2 weeks. ? ?2.  Hyperlipidemia:  much improved on Atorvastatin 20 mg daily.  Tolerating  well. ? ?3.  Tobacco abuse:  encouraged nicotine gum use to stop smoking cigars.  Discussed proper use. ? ?4.  HM:  referral for colonoscopy.  Does not want Moderna bivalent vaccine today.  Shingrix #1 and Tdap given. ? ?5.  Elevated ALT:  recheck in 2 months. ? ?

## 2021-05-19 ENCOUNTER — Ambulatory Visit: Payer: BC Managed Care – PPO | Admitting: Internal Medicine

## 2021-06-12 ENCOUNTER — Other Ambulatory Visit: Payer: BC Managed Care – PPO | Admitting: Internal Medicine

## 2021-06-19 ENCOUNTER — Other Ambulatory Visit (INDEPENDENT_AMBULATORY_CARE_PROVIDER_SITE_OTHER): Payer: BC Managed Care – PPO | Admitting: Internal Medicine

## 2021-06-19 VITALS — BP 192/92 | HR 76

## 2021-06-19 DIAGNOSIS — R7401 Elevation of levels of liver transaminase levels: Secondary | ICD-10-CM

## 2021-06-19 DIAGNOSIS — Z0130 Encounter for examination of blood pressure without abnormal findings: Secondary | ICD-10-CM

## 2021-06-19 NOTE — Progress Notes (Signed)
Discussion again today regarding importance of taking meds without missing.  Sounds like not only was he missing meds, but also frequently only taking Amlodipine 5 mg--so feel he is likely missing often if still with 5 mg tabs (should have run out with doubling up)

## 2021-06-19 NOTE — Progress Notes (Signed)
Patient reported that he occasionally misses bp medication and has been taking 1 tab of amlodipine 5mg  daily instead of 10mg . Patient was also taking dayquil/Nyquil all last week while experiencing a cold.  Patient has been rescheduled for bp recheck.

## 2021-06-20 LAB — ALT: ALT: 52 IU/L — ABNORMAL HIGH (ref 0–44)

## 2021-07-10 ENCOUNTER — Ambulatory Visit (INDEPENDENT_AMBULATORY_CARE_PROVIDER_SITE_OTHER): Payer: BC Managed Care – PPO

## 2021-07-10 VITALS — BP 162/90 | HR 72

## 2021-07-10 DIAGNOSIS — Z0130 Encounter for examination of blood pressure without abnormal findings: Secondary | ICD-10-CM | POA: Diagnosis not present

## 2021-07-10 MED ORDER — CARVEDILOL PHOSPHATE ER 10 MG PO CP24: 10.0000 mg | ORAL_CAPSULE | Freq: Every day | ORAL | 11 refills | Status: DC

## 2021-07-10 NOTE — Progress Notes (Signed)
Patient reports that he has been taking bp medication consistently without missing. Patient took bp medication this morning.

## 2021-07-10 NOTE — Progress Notes (Signed)
Add Carvedilol 24 h 10 mg daily. BP and pulse check in 2 weeks.

## 2021-07-17 ENCOUNTER — Ambulatory Visit: Payer: BC Managed Care – PPO | Admitting: Internal Medicine

## 2021-08-04 ENCOUNTER — Other Ambulatory Visit: Payer: Self-pay | Admitting: Internal Medicine

## 2021-10-22 DIAGNOSIS — E669 Obesity, unspecified: Secondary | ICD-10-CM | POA: Diagnosis not present

## 2021-10-22 DIAGNOSIS — E785 Hyperlipidemia, unspecified: Secondary | ICD-10-CM | POA: Diagnosis not present

## 2021-10-22 DIAGNOSIS — I10 Essential (primary) hypertension: Secondary | ICD-10-CM | POA: Diagnosis not present

## 2021-10-22 DIAGNOSIS — M109 Gout, unspecified: Secondary | ICD-10-CM | POA: Diagnosis not present

## 2021-12-17 DIAGNOSIS — R7989 Other specified abnormal findings of blood chemistry: Secondary | ICD-10-CM | POA: Diagnosis not present

## 2021-12-17 DIAGNOSIS — I10 Essential (primary) hypertension: Secondary | ICD-10-CM | POA: Diagnosis not present

## 2021-12-17 DIAGNOSIS — R7303 Prediabetes: Secondary | ICD-10-CM | POA: Diagnosis not present

## 2021-12-17 DIAGNOSIS — R945 Abnormal results of liver function studies: Secondary | ICD-10-CM | POA: Diagnosis not present

## 2021-12-17 DIAGNOSIS — E785 Hyperlipidemia, unspecified: Secondary | ICD-10-CM | POA: Diagnosis not present

## 2022-01-31 ENCOUNTER — Other Ambulatory Visit: Payer: Self-pay | Admitting: Internal Medicine

## 2022-02-23 ENCOUNTER — Other Ambulatory Visit: Payer: Self-pay | Admitting: Internal Medicine

## 2022-04-23 ENCOUNTER — Other Ambulatory Visit: Payer: Self-pay | Admitting: Internal Medicine

## 2022-04-24 ENCOUNTER — Other Ambulatory Visit: Payer: Self-pay | Admitting: Internal Medicine

## 2022-04-27 NOTE — Telephone Encounter (Signed)
Called patient and LVM asking to return the call.

## 2022-04-27 NOTE — Telephone Encounter (Signed)
Patient has been scheduled

## 2022-05-17 ENCOUNTER — Other Ambulatory Visit: Payer: Self-pay | Admitting: Internal Medicine

## 2022-05-18 ENCOUNTER — Other Ambulatory Visit: Payer: Self-pay | Admitting: Internal Medicine

## 2022-07-15 ENCOUNTER — Ambulatory Visit: Payer: BC Managed Care – PPO | Admitting: Internal Medicine

## 2022-07-16 ENCOUNTER — Other Ambulatory Visit: Payer: Self-pay | Admitting: Internal Medicine

## 2022-09-14 ENCOUNTER — Other Ambulatory Visit: Payer: Self-pay | Admitting: Internal Medicine

## 2022-10-07 ENCOUNTER — Other Ambulatory Visit: Payer: Self-pay | Admitting: Internal Medicine

## 2022-10-08 ENCOUNTER — Ambulatory Visit: Payer: BC Managed Care – PPO | Admitting: Internal Medicine

## 2022-10-12 ENCOUNTER — Telehealth: Payer: Self-pay | Admitting: Internal Medicine

## 2022-12-29 ENCOUNTER — Other Ambulatory Visit: Payer: Self-pay | Admitting: Internal Medicine

## 2023-01-05 ENCOUNTER — Other Ambulatory Visit: Payer: Self-pay | Admitting: Internal Medicine

## 2023-01-12 ENCOUNTER — Other Ambulatory Visit: Payer: Self-pay | Admitting: Internal Medicine

## 2023-02-05 NOTE — Telephone Encounter (Signed)
Called patient to remind him of his upcoming appointment on 02/09/2023. Patient did not answer, left a voicemail that stated the call was to remind him of appointment and to call back to confirm, also mentioned that if he cancels or does not show to appointment he will no longer get refills.

## 2023-02-09 ENCOUNTER — Telehealth: Payer: Self-pay | Admitting: Internal Medicine

## 2023-02-09 ENCOUNTER — Ambulatory Visit: Payer: BC Managed Care – PPO | Admitting: Internal Medicine

## 2023-02-09 NOTE — Telephone Encounter (Signed)
Patient had an appointment today 02/09/2023. Patient called today at 10:10 am to cancel the appointment.  Patient stated that he could not make it to appointment and had to cancel because he had to take his dog to get checked.    Patient has been informed that per Dr. Renne Crigler request if patient cancel appointment for 02/09/2023, he will not get any more refills.   Patient was rescheduled to 05/24/2023.

## 2023-02-11 ENCOUNTER — Other Ambulatory Visit: Payer: Self-pay | Admitting: Internal Medicine

## 2023-03-13 ENCOUNTER — Other Ambulatory Visit: Payer: Self-pay | Admitting: Internal Medicine

## 2023-05-24 ENCOUNTER — Ambulatory Visit: Payer: BC Managed Care – PPO | Admitting: Internal Medicine

## 2023-07-08 DIAGNOSIS — M109 Gout, unspecified: Secondary | ICD-10-CM | POA: Diagnosis not present

## 2023-07-08 DIAGNOSIS — E785 Hyperlipidemia, unspecified: Secondary | ICD-10-CM | POA: Diagnosis not present

## 2023-07-08 DIAGNOSIS — Z125 Encounter for screening for malignant neoplasm of prostate: Secondary | ICD-10-CM | POA: Diagnosis not present

## 2023-07-08 DIAGNOSIS — Z0189 Encounter for other specified special examinations: Secondary | ICD-10-CM | POA: Diagnosis not present

## 2023-07-08 DIAGNOSIS — K30 Functional dyspepsia: Secondary | ICD-10-CM | POA: Diagnosis not present

## 2023-07-08 DIAGNOSIS — R7303 Prediabetes: Secondary | ICD-10-CM | POA: Diagnosis not present

## 2023-07-08 DIAGNOSIS — Z23 Encounter for immunization: Secondary | ICD-10-CM | POA: Diagnosis not present

## 2023-07-08 DIAGNOSIS — Z1159 Encounter for screening for other viral diseases: Secondary | ICD-10-CM | POA: Diagnosis not present

## 2023-07-08 DIAGNOSIS — I10 Essential (primary) hypertension: Secondary | ICD-10-CM | POA: Diagnosis not present

## 2023-07-13 ENCOUNTER — Other Ambulatory Visit: Payer: Self-pay | Admitting: Family Medicine

## 2023-07-13 DIAGNOSIS — R109 Unspecified abdominal pain: Secondary | ICD-10-CM

## 2023-08-19 ENCOUNTER — Ambulatory Visit
Admission: RE | Admit: 2023-08-19 | Discharge: 2023-08-19 | Disposition: A | Discharge status: Home / Self Care | Payer: Self-pay | Source: Ambulatory Visit | Attending: Family Medicine | Admitting: Family Medicine

## 2023-08-19 DIAGNOSIS — R109 Unspecified abdominal pain: Secondary | ICD-10-CM

## 2023-08-19 MED ORDER — IOPAMIDOL (ISOVUE-300) INJECTION 61%
100.0000 mL | Freq: Once | INTRAVENOUS | Status: AC | PRN
  Administered 2023-08-19: 100 mL via INTRAVENOUS
